# Patient Record
Sex: Male | Born: 1985 | Race: White | Hispanic: No | Marital: Single | Smoking: Current every day smoker
Health system: Southern US, Community
[De-identification: ages and names within clinical notes are randomized; demographics above are authoritative.]

---

## 2016-08-16 ENCOUNTER — Emergency Department (HOSPITAL_COMMUNITY): Payer: Self-pay

## 2016-08-16 ENCOUNTER — Encounter (HOSPITAL_COMMUNITY): Payer: Self-pay

## 2016-08-16 ENCOUNTER — Emergency Department (HOSPITAL_COMMUNITY)
Admission: EM | Admit: 2016-08-16 | Discharge: 2016-08-16 | Disposition: A | Discharge status: Home / Self Care | Payer: Self-pay | Attending: Emergency Medicine | Admitting: Emergency Medicine

## 2016-08-16 DIAGNOSIS — R319 Hematuria, unspecified: Secondary | ICD-10-CM | POA: Insufficient documentation

## 2016-08-16 DIAGNOSIS — F17200 Nicotine dependence, unspecified, uncomplicated: Secondary | ICD-10-CM | POA: Insufficient documentation

## 2016-08-16 LAB — CBC WITH DIFFERENTIAL/PLATELET
Basophils Absolute: 0 10*3/uL (ref 0.0–0.1)
Basophils Relative: 0 %
EOS PCT: 2 %
Eosinophils Absolute: 0.2 10*3/uL (ref 0.0–0.7)
HEMATOCRIT: 43 % (ref 39.0–52.0)
Hemoglobin: 13.9 g/dL (ref 13.0–17.0)
LYMPHS ABS: 1.9 10*3/uL (ref 0.7–4.0)
LYMPHS PCT: 28 %
MCH: 26.7 pg (ref 26.0–34.0)
MCHC: 32.3 g/dL (ref 30.0–36.0)
MCV: 82.7 fL (ref 78.0–100.0)
MONO ABS: 0.6 10*3/uL (ref 0.1–1.0)
MONOS PCT: 9 %
NEUTROS ABS: 4 10*3/uL (ref 1.7–7.7)
Neutrophils Relative %: 61 %
PLATELETS: 226 10*3/uL (ref 150–400)
RBC: 5.2 MIL/uL (ref 4.22–5.81)
RDW: 15.1 % (ref 11.5–15.5)
WBC: 6.6 10*3/uL (ref 4.0–10.5)

## 2016-08-16 LAB — URINALYSIS, ROUTINE W REFLEX MICROSCOPIC
BILIRUBIN URINE: NEGATIVE
GLUCOSE, UA: NEGATIVE mg/dL
Ketones, ur: NEGATIVE mg/dL
Leukocytes, UA: NEGATIVE
Nitrite: NEGATIVE
Protein, ur: NEGATIVE mg/dL
SPECIFIC GRAVITY, URINE: 1.037 — AB (ref 1.005–1.030)
pH: 6.5 (ref 5.0–8.0)

## 2016-08-16 LAB — COMPREHENSIVE METABOLIC PANEL
ALK PHOS: 55 U/L (ref 38–126)
ALT: 11 U/L — AB (ref 17–63)
ANION GAP: 3 — AB (ref 5–15)
AST: 11 U/L — ABNORMAL LOW (ref 15–41)
Albumin: 3.6 g/dL (ref 3.5–5.0)
BUN: 15 mg/dL (ref 6–20)
CO2: 29 mmol/L (ref 22–32)
Calcium: 8.7 mg/dL — ABNORMAL LOW (ref 8.9–10.3)
Chloride: 109 mmol/L (ref 101–111)
Creatinine, Ser: 0.84 mg/dL (ref 0.61–1.24)
Glucose, Bld: 88 mg/dL (ref 65–99)
Potassium: 4.1 mmol/L (ref 3.5–5.1)
SODIUM: 141 mmol/L (ref 135–145)
TOTAL PROTEIN: 6.5 g/dL (ref 6.5–8.1)
Total Bilirubin: 0.5 mg/dL (ref 0.3–1.2)

## 2016-08-16 LAB — URINE MICROSCOPIC-ADD ON

## 2016-08-16 LAB — I-STAT CHEM 8, ED
BUN: 15 mg/dL (ref 6–20)
CALCIUM ION: 1.16 mmol/L (ref 1.15–1.40)
CREATININE: 0.9 mg/dL (ref 0.61–1.24)
Chloride: 104 mmol/L (ref 101–111)
GLUCOSE: 88 mg/dL (ref 65–99)
HCT: 43 % (ref 39.0–52.0)
HEMOGLOBIN: 14.6 g/dL (ref 13.0–17.0)
Potassium: 4 mmol/L (ref 3.5–5.1)
Sodium: 142 mmol/L (ref 135–145)
TCO2: 27 mmol/L (ref 0–100)

## 2016-08-16 MED ORDER — ONDANSETRON 4 MG PO TBDP: ORAL_TABLET | ORAL | 0 refills | Status: AC

## 2016-08-16 MED ORDER — HYDROCODONE-ACETAMINOPHEN 5-325 MG PO TABS: 1.0000 | ORAL_TABLET | Freq: Four times a day (QID) | ORAL | 0 refills | Status: AC | PRN

## 2016-08-16 MED ORDER — OXYCODONE-ACETAMINOPHEN 5-325 MG PO TABS: 1.0000 | ORAL_TABLET | ORAL | 0 refills | Status: AC | PRN

## 2016-08-16 MED ORDER — ONDANSETRON HCL 4 MG/2ML IJ SOLN
4.0000 mg | Freq: Once | INTRAMUSCULAR | Status: AC
  Administered 2016-08-16: 4 mg via INTRAVENOUS
  Filled 2016-08-16: qty 2

## 2016-08-16 MED ORDER — KETOROLAC TROMETHAMINE 30 MG/ML IJ SOLN
30.0000 mg | Freq: Once | INTRAMUSCULAR | Status: AC
  Administered 2016-08-16: 30 mg via INTRAVENOUS
  Filled 2016-08-16: qty 1

## 2016-08-16 NOTE — ED Provider Notes (Signed)
[facility]-EMERGENCY DEPT Provider Note   CSN: [id number] Arrival date & time: 08/16/16  4098     History   Chief Complaint Chief Complaint  Patient presents with  . Flank Pain    HPI Bradley Hoffman is a 30 y.o. male.  Patient complains of left flank pain. Patient has had a history of kidney stones before`   The history is provided by the patient.  Flank Pain  This is a new problem. The problem occurs constantly. The problem has not changed since onset.Pertinent negatives include no chest pain, no abdominal pain and no headaches. Nothing aggravates the symptoms. Nothing relieves the symptoms. He has tried nothing for the symptoms. The treatment provided no relief.    History reviewed. No pertinent past medical history.  There are no active problems to display for this patient.   History reviewed. No pertinent surgical history.     Home Medications    Prior to Admission medications   Medication Sig Start Date End Date Taking? Authorizing Provider  HYDROcodone-acetaminophen (NORCO/VICODIN) 5-325 MG tablet Take 1 tablet by mouth every 6 (six) hours as needed for moderate pain. 08/16/16   Bethann Berkshire, MD  ondansetron (ZOFRAN ODT) 4 MG disintegrating tablet 4mg  ODT q4 hours prn nausea/vomit 08/16/16   Bethann Berkshire, MD    Family History History reviewed. No pertinent family history.  Social History Social History  Substance Use Topics  . Smoking status: Current Every Day Smoker  . Smokeless tobacco: Never Used  . Alcohol use No     Allergies   Review of patient's allergies indicates no known allergies.   Review of Systems Review of Systems  Constitutional: Negative for appetite change and fatigue.  HENT: Negative for congestion, ear discharge and sinus pressure.   Eyes: Negative for discharge.  Respiratory: Negative for cough.   Cardiovascular: Negative for chest pain.  Gastrointestinal: Negative for abdominal pain and diarrhea.  Genitourinary: Positive for  flank pain. Negative for frequency and hematuria.  Musculoskeletal: Negative for back pain.  Skin: Negative for rash.  Neurological: Negative for seizures and headaches.  Psychiatric/Behavioral: Negative for hallucinations.     Physical Exam Updated Vital Signs BP 119/81 (BP Location: Left Arm)   Pulse (!) 50   Temp 97.9 F (36.6 C) (Oral)   Resp 18   Ht 5\' 11"  (1.803 m)   Wt 260 lb (117.9 kg)   SpO2 98%   BMI 36.26 kg/m   Physical Exam  Constitutional: He is oriented to person, place, and time. He appears well-developed.  HENT:  Head: Normocephalic.  Eyes: Conjunctivae and EOM are normal. No scleral icterus.  Neck: Neck supple. No thyromegaly present.  Cardiovascular: Normal rate and regular rhythm.  Exam reveals no gallop and no friction rub.   No murmur heard. Pulmonary/Chest: No stridor. He has no wheezes. He has no rales. He exhibits no tenderness.  Abdominal: He exhibits no distension. There is no tenderness. There is no rebound.  Genitourinary:  Genitourinary Comments: Tender left flank  Musculoskeletal: Normal range of motion. He exhibits no edema.  Lymphadenopathy:    He has no cervical adenopathy.  Neurological: He is oriented to person, place, and time. He exhibits normal muscle tone. Coordination normal.  Skin: No rash noted. No erythema.  Psychiatric: He has a normal mood and affect. His behavior is normal.     ED Treatments / Results  Labs (all labs ordered are listed, but only abnormal results are displayed) Labs Reviewed  URINALYSIS, ROUTINE W REFLEX MICROSCOPIC (NOT  AT [facility]) - Abnormal; Notable for the following:       Result Value   APPearance CLOUDY (*)    Specific Gravity, Urine 1.037 (*)    Hgb urine dipstick LARGE (*)    All other components within normal limits  COMPREHENSIVE METABOLIC PANEL - Abnormal; Notable for the following:    Calcium 8.7 (*)    AST 11 (*)    ALT 11 (*)    Anion gap 3 (*)    All other components within normal  limits  URINE MICROSCOPIC-ADD ON - Abnormal; Notable for the following:    Squamous Epithelial / LPF 0-5 (*)    Bacteria, UA FEW (*)    All other components within normal limits  CBC WITH DIFFERENTIAL/PLATELET  I-STAT CHEM 8, ED    EKG  EKG Interpretation None       Radiology Ct Renal Stone Study  Result Date: 08/16/2016 CLINICAL DATA:  Left flank pain beginning 2 days ago. Urinary frequency. History of kidney stones. EXAM: CT ABDOMEN AND PELVIS WITHOUT CONTRAST TECHNIQUE: Multidetector CT imaging of the abdomen and pelvis was performed following the standard protocol without IV contrast. COMPARISON:  None. FINDINGS: Minimal dependent atelectasis in the lung bases along with mildly prominent extrapleural fat. No pleural effusion. The liver, gallbladder, spleen, adrenal glands, and pancreas have an unremarkable unenhanced appearance. No renal calculi or hydronephrosis is seen. No stones are identified along the course of the ureters, which are nondilated. The bladder is decompressed. The stomach is unremarkable. The duodenum does not cross the midline, and the jejunum is located in the right abdomen. There is no evidence of bowel obstruction. The colon appears normal in position. The appendix is identified in the right lower quadrant and is unremarkable. The abdominal aorta is normal in caliber. No free fluid or enlarged lymph nodes are identified. Postsurgical changes are noted in the midline abdominal wall. No acute osseous abnormality is identified. IMPRESSION: 1. No evidence of urinary tract calculi or obstruction. 2. 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 No bowel obstruction. Electronically Signed   By: Sebastian AcheAllen  Grady M.D.   On: 08/16/2016 08:21    Procedures Procedures (including critical care time)  Medications Ordered in ED Medications  ketorolac (TORADOL) 30 MG/ML injection 30 mg (30 mg Intravenous Given 08/16/16 0726)  ondansetron  (ZOFRAN) injection 4 mg (4 mg Intravenous Given 08/16/16 0727)     Initial Impression / Assessment and Plan / ED Course  I have reviewed the triage vital signs and the nursing notes.  Pertinent labs & imaging results that were available during my care of the patient were reviewed by me and considered in my medical decision making (see chart for details).  Clinical Course   CT scan does not show a kidney stone. Urinalysis shows hematuria. Possible passed stone. CT scan also shows  malrotation of the intestines. Patient was referred to urology and given prescriptions for Vicodin and Zofran  Final Clinical Impressions(s) / ED Diagnoses   Final diagnoses:  Hematuria    New Prescriptions New Prescriptions   HYDROCODONE-ACETAMINOPHEN (NORCO/VICODIN) 5-325 MG TABLET    Take 1 tablet by mouth every 6 (six) hours as needed for moderate pain.   ONDANSETRON (ZOFRAN ODT) 4 MG DISINTEGRATING TABLET    4mg  ODT q4 hours prn nausea/vomit     Bethann BerkshireJoseph Verley Pariseau, MD 08/16/16 [phone]

## 2016-08-16 NOTE — Discharge Instructions (Addendum)
Follow up with [facility] next week and follow up with the stomach md

## 2016-08-16 NOTE — ED Triage Notes (Signed)
Pt complains of left flank pain since early Sunday morning, hx of kidney stones and the pain is the same, he also complains of urinary frequency

## 2018-04-09 IMAGING — CT CT RENAL STONE PROTOCOL
2 of 3 series · 17 of 46 positions shown, 19 images · non-contrast
Comparison: None.

CLINICAL DATA: Left flank pain beginning 2 days ago. Urinary
frequency. History of kidney stones.

EXAM:
CT ABDOMEN AND PELVIS WITHOUT CONTRAST
TECHNIQUE: Multidetector CT imaging of the abdomen and pelvis was performed
following the standard protocol without IV contrast.

[Series 4: lung · axial · 0.90mm/px · z∈[+1492,+1566]mm · 14 of 43 slices shown, 16 images]
[im 3/43  soft-tissue]
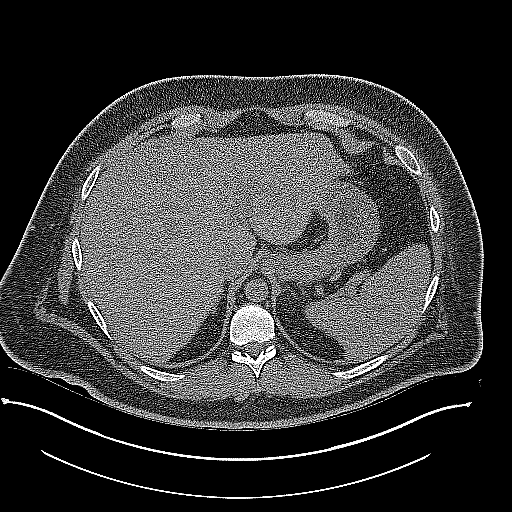
[im 3/43  bone]
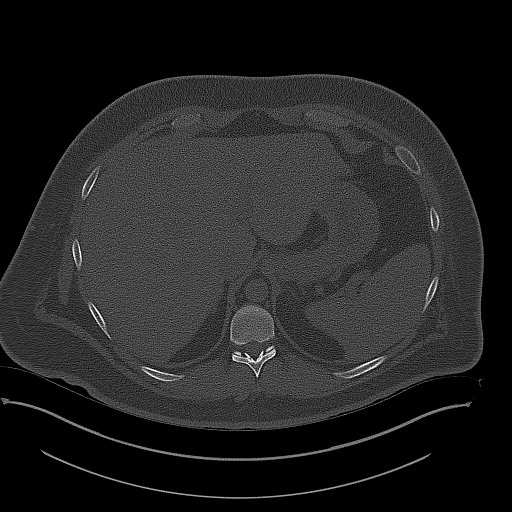
[im 6/43  soft-tissue]
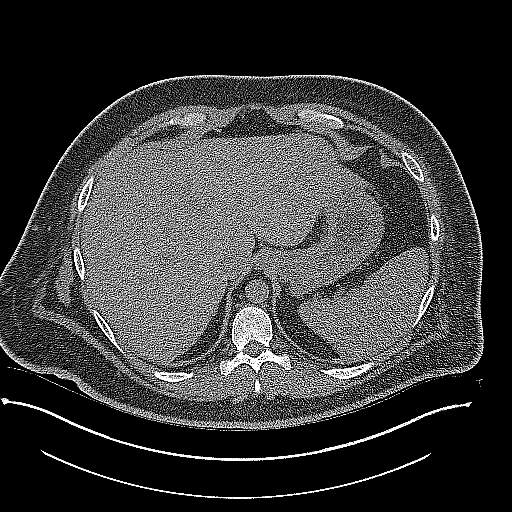
[im 9/43  soft-tissue]
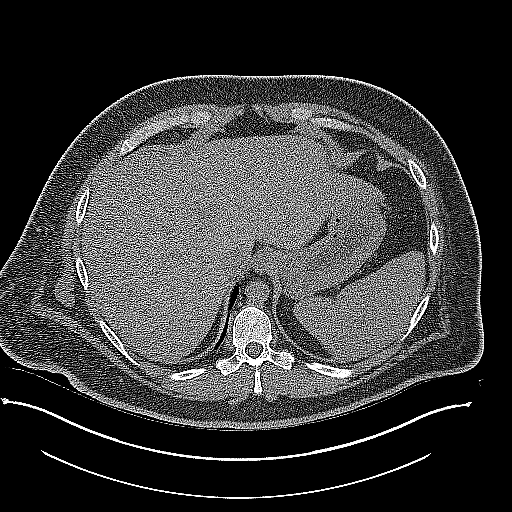
[im 11/43  soft-tissue]
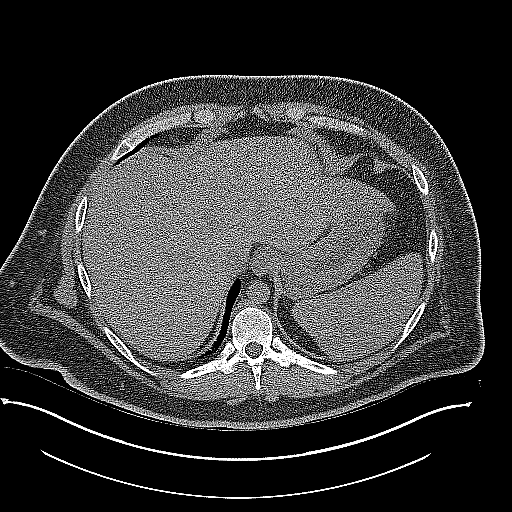
[im 14/43  soft-tissue]
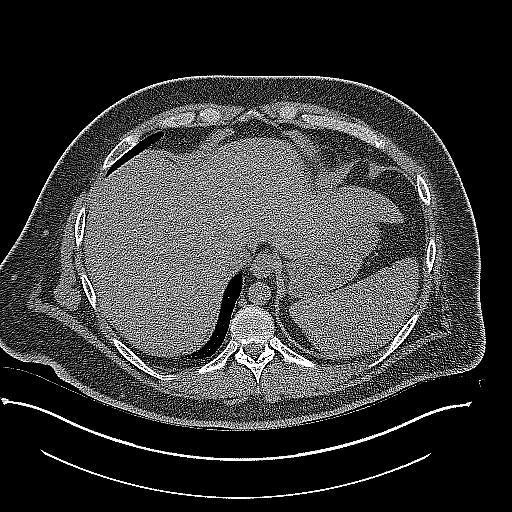
[im 17/43  soft-tissue]
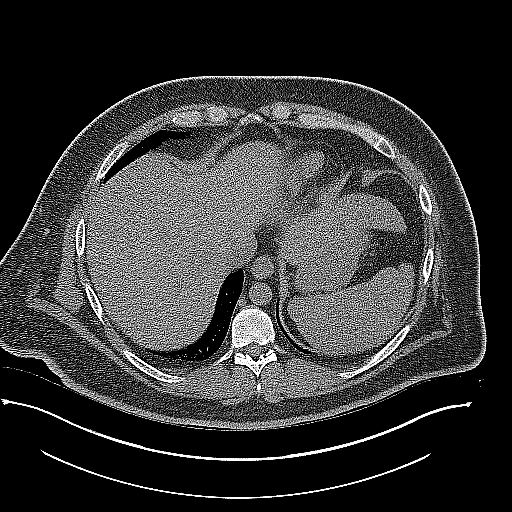
[im 19/43  soft-tissue]
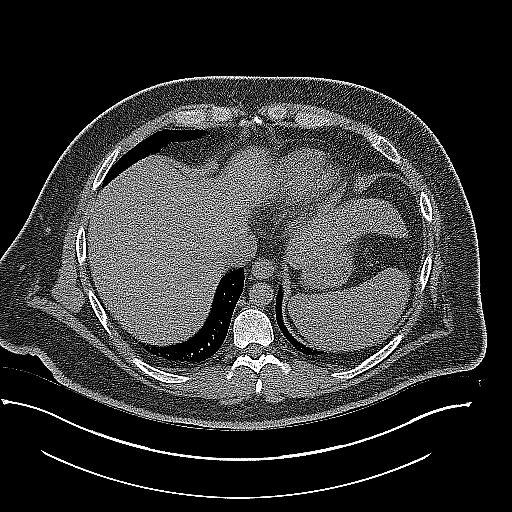
[im 24/43  soft-tissue]
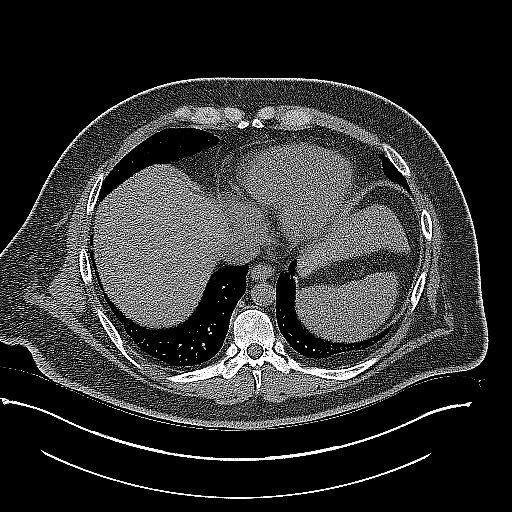
[im 26/43  soft-tissue]
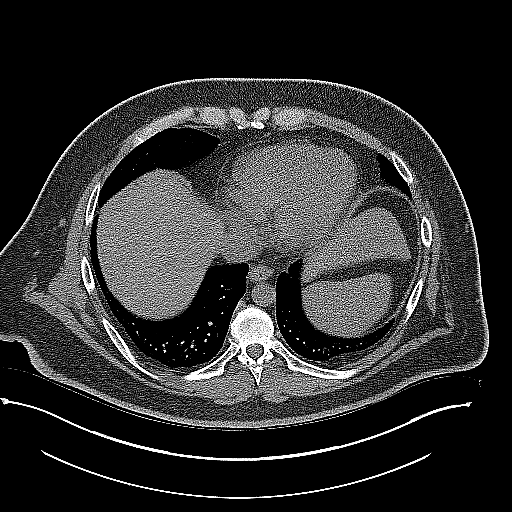
[im 26/43  bone]
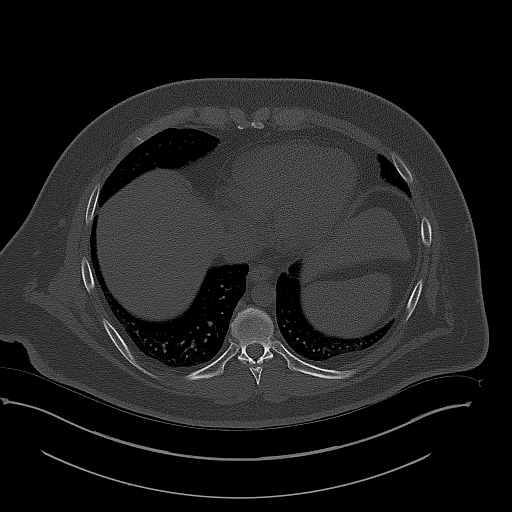
[im 29/43  soft-tissue]
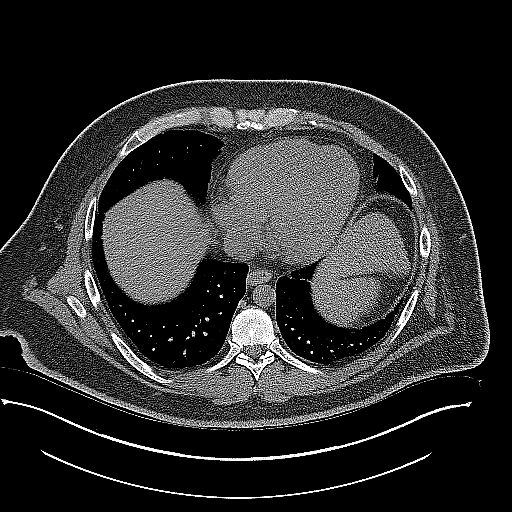
[im 32/43  soft-tissue]
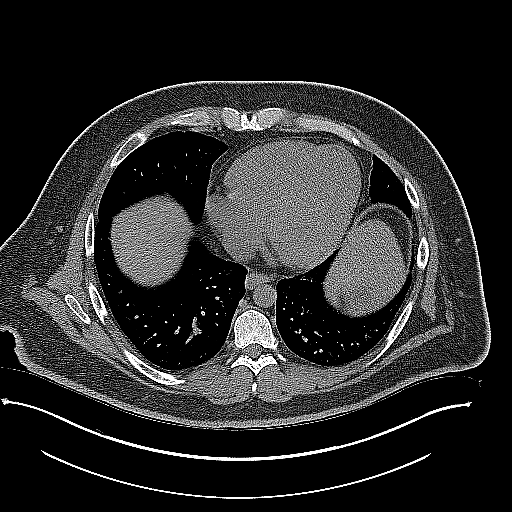
[im 34/43  soft-tissue]
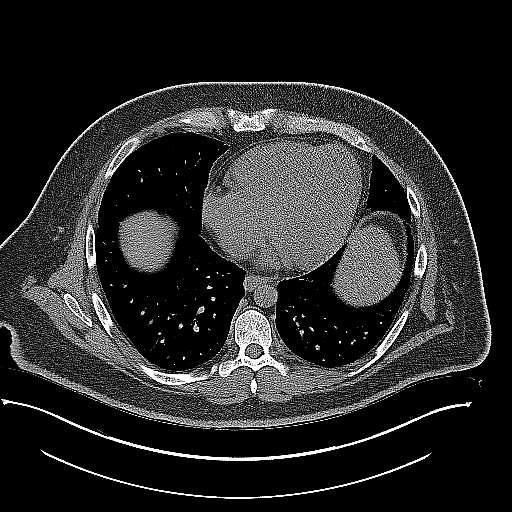
[im 37/43  soft-tissue]
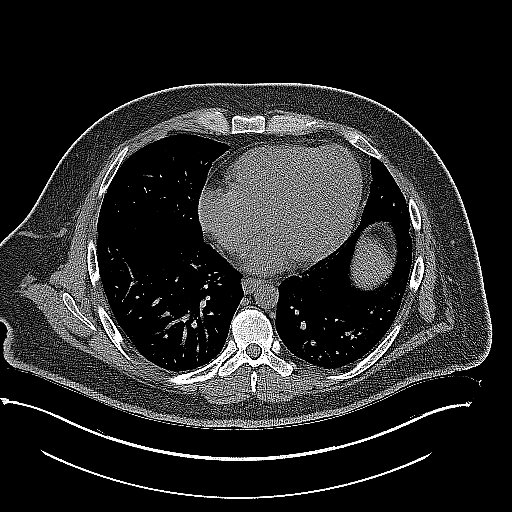
[im 40/43  soft-tissue]
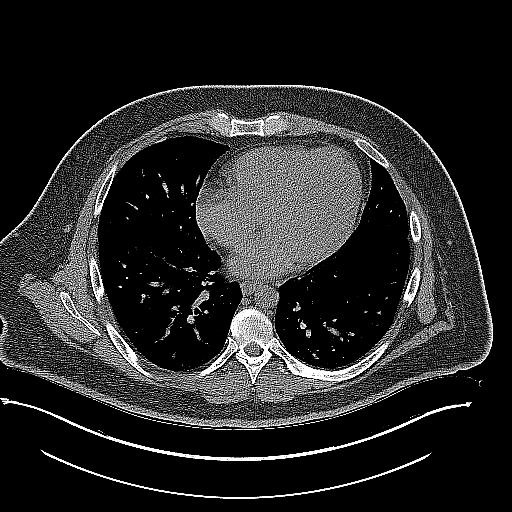

[Series 5: coronal · coronal · 0.90mm/px · 3 of 150 slices shown]
[im 50/150  soft-tissue]
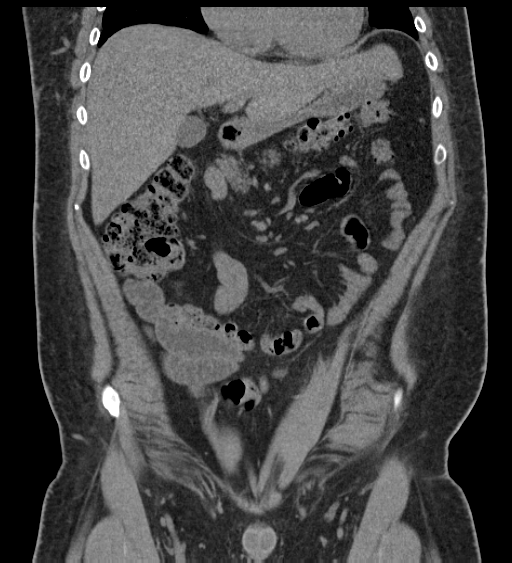
[im 67/150  soft-tissue]
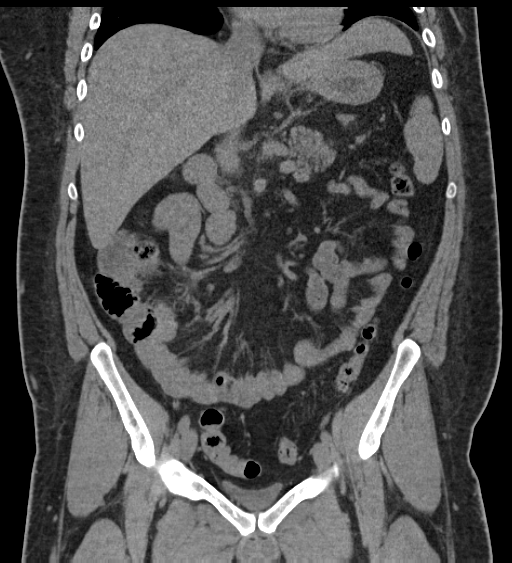
[im 83/150  soft-tissue]
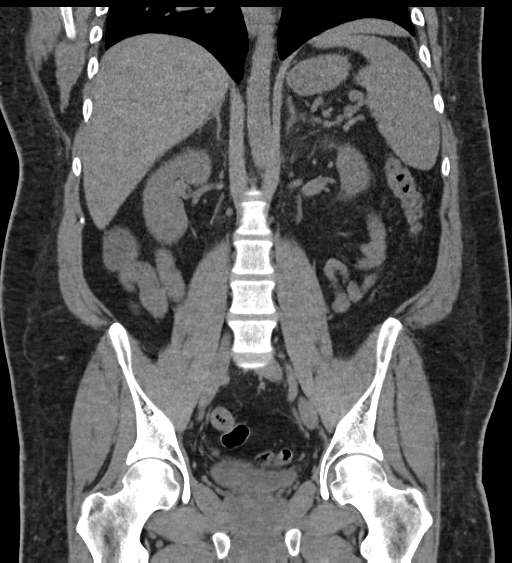

[17 of 46 positions shown; findings below may reference images not displayed]

FINDINGS: Minimal dependent atelectasis in the lung bases along with mildly
prominent extrapleural fat. No pleural effusion.

The liver, gallbladder, spleen, adrenal glands, and pancreas have an
unremarkable unenhanced appearance. No renal calculi or
hydronephrosis is seen. No stones are identified along the course of
the ureters, which are nondilated. The bladder is decompressed.

The stomach is unremarkable. The duodenum does not cross the
midline, and the jejunum is located in the right abdomen. There is
no evidence of bowel obstruction. The colon appears normal in
position. The appendix is identified in the right lower quadrant and
is unremarkable.

The abdominal aorta is normal in caliber. No free fluid or enlarged
lymph nodes are identified. Postsurgical changes are noted in the
midline abdominal wall. No acute osseous abnormality is identified.
IMPRESSION: 1. No evidence of urinary tract calculi or obstruction.
2. Abnormal position of the proximal small bowel compatible with
intestinal malrotation. No bowel obstruction.
